# Patient Record
Sex: Female | Born: 1975 | Race: White
Health system: Northeastern US, Academic
[De-identification: ages and names within clinical notes are randomized; demographics above are authoritative.]

## PROBLEM LIST (undated history)

## (undated) DIAGNOSIS — I10 Essential (primary) hypertension: Secondary | ICD-10-CM

## (undated) HISTORY — PX: CARDIAC SURGERY: SHX584

## (undated) HISTORY — DX: Essential (primary) hypertension: I10

---

## 2005-05-17 ENCOUNTER — Inpatient Hospital Stay: Admit: 2005-05-17 | Discharge: 2005-05-17 | Disposition: A | Discharge status: Home / Self Care | Payer: Self-pay

## 2005-05-17 ENCOUNTER — Other Ambulatory Visit: Payer: Self-pay | Admitting: Gastroenterology

## 2005-10-19 ENCOUNTER — Inpatient Hospital Stay: Admit: 2005-10-19 | Discharge: 2005-10-19 | Disposition: A | Discharge status: Home / Self Care | Payer: Self-pay

## 2006-05-04 ENCOUNTER — Other Ambulatory Visit: Payer: Self-pay | Admitting: Gastroenterology

## 2006-05-04 ENCOUNTER — Inpatient Hospital Stay: Admit: 2006-05-04 | Discharge: 2006-05-04 | Disposition: A | Discharge status: Home / Self Care | Payer: Self-pay

## 2010-10-03 ENCOUNTER — Emergency Department: Payer: Self-pay | Admitting: *Deleted

## 2011-01-22 ENCOUNTER — Emergency Department: Payer: Self-pay | Admitting: Emergency Medicine

## 2011-01-22 LAB — TROPONIN I: Troponin-I: <0.02 ng/mL

## 2011-01-22 LAB — CBC
HCT: 37.6 % (ref 35.0–47.0)
HGB: 13.1 g/dL (ref 12.0–16.0)
MCH: 30.1 pg (ref 26.0–34.0)
MCV: 87 fL (ref 80–100)
RBC: 4.34 10*6/uL (ref 3.80–5.20)

## 2011-01-22 LAB — BASIC METABOLIC PANEL
Calcium, Total: 8.3 mg/dL — ABNORMAL LOW (ref 8.5–10.1)
Chloride: 103 mmol/L (ref 98–107)
Co2: 26 mmol/L (ref 21–32)
EGFR (African American): >60
Glucose: 160 mg/dL — ABNORMAL HIGH (ref 65–99)
Osmolality: 283 (ref 275–301)
Sodium: 140 mmol/L (ref 136–145)

## 2011-06-10 ENCOUNTER — Emergency Department: Payer: Self-pay | Admitting: Emergency Medicine

## 2011-06-11 LAB — URINALYSIS, COMPLETE
Bacteria: NONE SEEN
Bilirubin,UR: NEGATIVE
Glucose,UR: 50 mg/dL (ref 0–75)
Leukocyte Esterase: NEGATIVE
Nitrite: NEGATIVE
Ph: 6 (ref 4.5–8.0)
RBC,UR: 4 /HPF (ref 0–5)
Specific Gravity: 1.023 (ref 1.003–1.030)

## 2011-06-11 LAB — PREGNANCY, URINE: Pregnancy Test, Urine: NEGATIVE m[IU]/mL

## 2011-07-06 ENCOUNTER — Emergency Department: Payer: Self-pay | Admitting: Emergency Medicine

## 2011-07-08 LAB — BETA STREP CULTURE(ARMC)

## 2013-01-31 ENCOUNTER — Other Ambulatory Visit: Payer: Self-pay | Admitting: Gastroenterology

## 2013-01-31 ENCOUNTER — Inpatient Hospital Stay: Admit: 2013-01-31 | Discharge: 2013-01-31 | Disposition: A | Discharge status: Home / Self Care | Payer: Self-pay

## 2013-08-08 ENCOUNTER — Other Ambulatory Visit: Payer: Self-pay | Admitting: Gastroenterology

## 2013-08-08 ENCOUNTER — Inpatient Hospital Stay: Admit: 2013-08-08 | Discharge: 2013-08-08 | Disposition: A | Discharge status: Home / Self Care | Payer: Self-pay

## 2013-10-05 ENCOUNTER — Inpatient Hospital Stay: Admit: 2013-10-05 | Discharge: 2013-10-05 | Disposition: A | Discharge status: Home / Self Care | Payer: Self-pay

## 2013-10-05 ENCOUNTER — Other Ambulatory Visit: Payer: Self-pay | Admitting: Gastroenterology

## 2015-11-05 DIAGNOSIS — E1149 Type 2 diabetes mellitus with other diabetic neurological complication: Secondary | ICD-10-CM

## 2015-11-05 DIAGNOSIS — Z794 Long term (current) use of insulin: Secondary | ICD-10-CM

## 2015-11-05 HISTORY — DX: Type 2 diabetes mellitus with other diabetic neurological complication: E11.49

## 2015-11-05 HISTORY — DX: Long term (current) use of insulin: Z79.4

## 2015-11-11 DIAGNOSIS — Z86711 Personal history of pulmonary embolism: Secondary | ICD-10-CM | POA: Insufficient documentation

## 2015-11-11 DIAGNOSIS — F32A Depression, unspecified: Secondary | ICD-10-CM | POA: Insufficient documentation

## 2015-11-11 DIAGNOSIS — Z720 Tobacco use: Secondary | ICD-10-CM | POA: Insufficient documentation

## 2015-11-11 DIAGNOSIS — D72820 Lymphocytosis (symptomatic): Secondary | ICD-10-CM | POA: Insufficient documentation

## 2015-11-11 DIAGNOSIS — I252 Old myocardial infarction: Secondary | ICD-10-CM | POA: Insufficient documentation

## 2015-11-11 DIAGNOSIS — E781 Pure hyperglyceridemia: Secondary | ICD-10-CM | POA: Insufficient documentation

## 2015-11-11 DIAGNOSIS — Z955 Presence of coronary angioplasty implant and graft: Secondary | ICD-10-CM | POA: Insufficient documentation

## 2015-11-13 DIAGNOSIS — M542 Cervicalgia: Secondary | ICD-10-CM | POA: Insufficient documentation

## 2015-12-26 ENCOUNTER — Encounter: Payer: Self-pay | Admitting: Gastroenterology

## 2015-12-26 ENCOUNTER — Encounter: Payer: Self-pay | Admitting: Pain Medicine

## 2016-02-05 ENCOUNTER — Ambulatory Visit: Payer: Self-pay | Admitting: Pain Medicine

## 2016-02-05 ENCOUNTER — Encounter: Payer: Self-pay | Admitting: Pain Medicine

## 2016-02-05 VITALS — BP 108/68 | HR 65 | Temp 97.3°F | Resp 14 | Ht 61.0 in | Wt 232.0 lb

## 2016-02-05 DIAGNOSIS — M5416 Radiculopathy, lumbar region: Secondary | ICD-10-CM

## 2016-02-05 DIAGNOSIS — M7918 Myalgia, other site: Secondary | ICD-10-CM

## 2016-02-05 DIAGNOSIS — M533 Sacrococcygeal disorders, not elsewhere classified: Secondary | ICD-10-CM

## 2016-02-05 DIAGNOSIS — M5412 Radiculopathy, cervical region: Secondary | ICD-10-CM

## 2016-02-05 DIAGNOSIS — E1140 Type 2 diabetes mellitus with diabetic neuropathy, unspecified: Secondary | ICD-10-CM

## 2016-02-05 MED ORDER — LIDOCAINE 5 % EX PTCH *I*
1.0000 | MEDICATED_PATCH | CUTANEOUS | 3 refills | Status: AC
Start: 2016-02-05 — End: ?

## 2016-02-05 NOTE — H&P (Signed)
Pain Medicine Consult Note    This is a confidential report written only for the purpose of professional communication.  Clients who wish to receive these findings are requested to contact the Pain Treatment Center at [facility] [phone].       Wanda Steele is a 41 y.o. year old female.  DOB: [birth year]  Primary Care Physician: Melinda Crutch, MD    Chief Complaint:   Cervical and lumbar spine pain    History of Present Illness:    This is a pleasant 41 year old female who presents today for new patient consultation for both neck and low back pain.  She states she has had these pain problems for over a decade.  She was previously followed in [place] at [facility] where she was undergoing what sounds like cervical epidural steroid injections.  [place] is now relocated to [place] would like to reestablish pain management care.    At present she reports pain in her cervical spine which radiates into the shoulders and low back pain which radiates into the buttocks and bilateral lower extremities.  The pain is ranging anywhere from 5 out of 10-8 out of 10 in severity.  The pain is worse with ambulation worse with prolonged standing and movement alleviated with rest and medication albeit only modestly.  She has not had any recent imaging and presents with no imaging to review.  She denies bowel or bladder incontinence or any focal neurologic deficits.        Past Medical History  Her medical history is otherwise significant for coronary artery disease status post stent on Plavix, hypertension bronchitis GERD, diabetes type 2 with polyneuropathy, anxiety, history of MI, obesity, depression, bipolar.  Currently Active Problems  There is no problem list on file for this patient.      Past Surgical History  No past surgical history on file.    Family History  No family history on file.    Social History  Social History     Social History    Marital status: Single     Spouse name: N/A     Number of children: N/A    Years of education: N/A     Occupational History    Not on file.     Social History Main Topics    Smoking status: Current Every Day Smoker    Smokeless tobacco: Never Used    Alcohol use Not on file    Drug use: Not on file    Sexual activity: Not on file     Social History Narrative    No narrative on file           Allergies:   Allergies   Allergen Reactions    Latex Rash     & slimy    Aspirin Other (See Comments)     Pancreas flare up        Current Meds:    Current Outpatient Prescriptions   Medication    FREESTYLE LITE test strip    amitriptyline (ELAVIL) 50 MG tablet    atorvastatin (LIPITOR) 40 MG tablet    clopidogrel (PLAVIX) 75 MG tablet    FLUoxetine (PROZAC) 40 MG capsule    insulin glargine 100 unit/mL injection pen    lisinopril (PRINIVIL,ZESTRIL) 10 MG tablet    LORazepam (ATIVAN) 0.5 MG tablet    metFORMIN (GLUCOPHAGE) 1000 MG tablet    metoprolol (LOPRESSOR) 50 MG tablet    mirtazapine (REMERON) 30 MG  tablet    pantoprazole (PROTONIX) 40 MG EC tablet    ranitidine (ZANTAC) 150 MG tablet    varenicline (CHANTIX) 1 MG tablet    lidocaine (LIDODERM) 5 % patch     No current facility-administered medications for this visit.          Review Of Systems: A 14 item review of systems was performed per HPI otherwise negative.  She denies bowel or bladder incontinence focal neurologic deficits chest pain fevers or shortness of breath.      Physical Exam:  Pain    02/05/16 1357   PainSc:   8   PainLoc: Back      BP 108/68   Pulse 65   Temp 36.3 C (97.3 F)   Resp 14   Ht 1.549 m (5\' 1" )   Wt 105.2 kg (232 lb)   SpO2 96%   BMI 43.84 kg/m2      Gen: awake, alert, responsive, NAD  HEENT: NCAT  Skin: no rashes, lesions or alopecia  Abd: soft, ND  Resp: symmetric respiratory excursions  CV: 2+ distal pulses  Ext: no clubbing, cyanosis or edema  MSK:    Cervical:Decreased ROM in flexion and extension rotation and lateral bending. ; TTP, paraspinal  tenderness   Lumbar: Decreased ROM in flexion extension ; TTP, paraspinal tenderness; tenderness over bilateral sacroiliac joints and direct pressure.  Neuro: 5/5 BUE motor, 2/4 BUE reflexes, sensory intact BUE; 5/5 BLE motor, intact sensory and reflexes in BLE    Assessment:  Pleasant 41 year old woman with cervical lumbar back pain that appears to be a combination of radiculitis myofascial pain and osteoarthritis.  She does not have any recent imaging to review.  I requested that she obtain her prior imaging records from her previous pain clinic.  In addition it has been several years since she has had any cervical imaging therefore we will order a cervical MRI given the radiculopathy and weakness in her upper extremities.  She'll follow-up with me in clinic after she obtains this imaging.    Plan:   1.  Cervical MRI; consider cervical epidural steroid injection versus bilateral sacroiliac joint injection.  2.  Lidoderm patch for peripheral diabetic neuropathy.    3.  Follow-up 4 weeks.    Author: Lidia CollumAdam Zanden Colver, MD as of: 02/05/2016 at: 2:28 PM      Thank you for referring this patient to the [facility].    Sincerely,    Coralee NorthAdam J. Nandita Mathenia, M.D.  Chief, Division of Pain Medicine  Director, Pain Treatment Center  Department of Anesthesiology & Perioperative Medicine  [facility]    Associate Professor  [facility] [employer] [facility] [employer]  [address], [address]  [place], [place] [id number]  Phone: [phone]  Fax: [phone]

## 2016-03-05 ENCOUNTER — Ambulatory Visit: Admission: RE | Admit: 2016-03-05 | Payer: Self-pay | Source: Ambulatory Visit | Admitting: Pain Medicine

## 2016-03-05 ENCOUNTER — Ambulatory Visit: Payer: Self-pay | Admitting: Pain Medicine

## 2016-03-22 ENCOUNTER — Encounter: Payer: Self-pay | Admitting: Pain Medicine

## 2016-03-22 ENCOUNTER — Encounter: Payer: Self-pay | Admitting: Anesthesiology

## 2016-03-22 ENCOUNTER — Ambulatory Visit: Payer: MEDICAID | Attending: Pain Medicine | Admitting: Pain Medicine

## 2016-03-22 VITALS — BP 127/75 | HR 76 | Temp 96.6°F | Resp 14 | Ht 61.0 in | Wt 232.0 lb

## 2016-03-22 DIAGNOSIS — K219 Gastro-esophageal reflux disease without esophagitis: Secondary | ICD-10-CM | POA: Insufficient documentation

## 2016-03-22 DIAGNOSIS — F419 Anxiety disorder, unspecified: Secondary | ICD-10-CM | POA: Insufficient documentation

## 2016-03-22 DIAGNOSIS — G894 Chronic pain syndrome: Secondary | ICD-10-CM | POA: Insufficient documentation

## 2016-03-22 DIAGNOSIS — M542 Cervicalgia: Secondary | ICD-10-CM

## 2016-03-22 DIAGNOSIS — M4722 Other spondylosis with radiculopathy, cervical region: Secondary | ICD-10-CM

## 2016-03-22 DIAGNOSIS — M5412 Radiculopathy, cervical region: Secondary | ICD-10-CM | POA: Insufficient documentation

## 2016-03-22 DIAGNOSIS — K5900 Constipation, unspecified: Secondary | ICD-10-CM | POA: Insufficient documentation

## 2016-03-22 DIAGNOSIS — J449 Chronic obstructive pulmonary disease, unspecified: Secondary | ICD-10-CM | POA: Insufficient documentation

## 2016-03-22 DIAGNOSIS — M791 Myalgia: Secondary | ICD-10-CM | POA: Insufficient documentation

## 2016-03-22 DIAGNOSIS — M545 Low back pain: Secondary | ICD-10-CM

## 2016-03-22 DIAGNOSIS — M7918 Myalgia, other site: Secondary | ICD-10-CM

## 2016-03-22 DIAGNOSIS — I10 Essential (primary) hypertension: Secondary | ICD-10-CM | POA: Insufficient documentation

## 2016-03-22 DIAGNOSIS — M1288 Other specific arthropathies, not elsewhere classified, other specified site: Secondary | ICD-10-CM

## 2016-03-22 DIAGNOSIS — I2510 Atherosclerotic heart disease of native coronary artery without angina pectoris: Secondary | ICD-10-CM | POA: Insufficient documentation
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
## 2016-03-24 ENCOUNTER — Other Ambulatory Visit: Payer: Self-pay | Admitting: Pain Medicine

## 2016-03-30 ENCOUNTER — Telehealth: Payer: Self-pay

## 2016-03-30 NOTE — Telephone Encounter (Signed)
Patients sister is calling about an MRI that is scheduled for Wanda Steele on Friday, 3/30. She stated she brought old imaging to our office and the last note from Dr. Barnet Glasgowarinci stated no new imaging is necessary but there is an MRI ordered from Dr. Barnet Glasgowarinci. She would like to know if we still want the patient to get the MRI on Friday. Please advise. Thank you.

## 2016-03-31 NOTE — Telephone Encounter (Signed)
Thank you, I called and let her know.

## 2016-04-02 ENCOUNTER — Ambulatory Visit: Payer: MEDICAID | Admitting: Radiology

## 2016-07-09 ENCOUNTER — Ambulatory Visit: Payer: MEDICAID | Attending: Internal Medicine

## 2016-07-09 ENCOUNTER — Other Ambulatory Visit: Payer: Self-pay | Admitting: Emergency Medicine

## 2016-07-09 DIAGNOSIS — S6992XA Unspecified injury of left wrist, hand and finger(s), initial encounter: Secondary | ICD-10-CM

## 2016-07-09 DIAGNOSIS — S4992XA Unspecified injury of left shoulder and upper arm, initial encounter: Secondary | ICD-10-CM

## 2016-07-09 DIAGNOSIS — W19XXXA Unspecified fall, initial encounter: Secondary | ICD-10-CM

## 2017-07-04 ENCOUNTER — Emergency Department: Payer: Medicaid Other

## 2017-07-04 ENCOUNTER — Emergency Department
Admission: EM | Admit: 2017-07-04 | Discharge: 2017-07-04 | Payer: Medicaid Other | Attending: Emergency Medicine | Admitting: Emergency Medicine

## 2017-07-04 DIAGNOSIS — I63232 Cerebral infarction due to unspecified occlusion or stenosis of left carotid arteries: Secondary | ICD-10-CM | POA: Diagnosis not present

## 2017-07-04 DIAGNOSIS — Z794 Long term (current) use of insulin: Secondary | ICD-10-CM | POA: Insufficient documentation

## 2017-07-04 DIAGNOSIS — G8190 Hemiplegia, unspecified affecting unspecified side: Secondary | ICD-10-CM | POA: Insufficient documentation

## 2017-07-04 DIAGNOSIS — R4182 Altered mental status, unspecified: Secondary | ICD-10-CM | POA: Diagnosis present

## 2017-07-04 DIAGNOSIS — I6522 Occlusion and stenosis of left carotid artery: Secondary | ICD-10-CM

## 2017-07-04 DIAGNOSIS — R402431 Glasgow coma scale score 3-8, in the field [EMT or ambulance]: Secondary | ICD-10-CM | POA: Diagnosis not present

## 2017-07-04 DIAGNOSIS — R739 Hyperglycemia, unspecified: Secondary | ICD-10-CM | POA: Diagnosis not present

## 2017-07-04 DIAGNOSIS — Z7902 Long term (current) use of antithrombotics/antiplatelets: Secondary | ICD-10-CM | POA: Insufficient documentation

## 2017-07-04 LAB — URINALYSIS, ROUTINE W REFLEX MICROSCOPIC
BILIRUBIN URINE: NEGATIVE
Bacteria, UA: NONE SEEN
Glucose, UA: >=500 mg/dL — AB
Hgb urine dipstick: NEGATIVE
Ketones, ur: 20 mg/dL — AB
Leukocytes, UA: NEGATIVE
Nitrite: NEGATIVE
PH: 6 (ref 5.0–8.0)
Protein, ur: NEGATIVE mg/dL

## 2017-07-04 LAB — URINE DRUG SCREEN, QUALITATIVE (ARMC ONLY)
AMPHETAMINES, UR SCREEN: NOT DETECTED
BENZODIAZEPINE, UR SCRN: NOT DETECTED
Cannabinoid 50 Ng, Ur ~~LOC~~: NOT DETECTED
Cocaine Metabolite,Ur ~~LOC~~: NOT DETECTED
MDMA (Ecstasy)Ur Screen: NOT DETECTED
Methadone Scn, Ur: NOT DETECTED
Opiate, Ur Screen: NOT DETECTED
PHENCYCLIDINE (PCP) UR S: NOT DETECTED
Tricyclic, Ur Screen: POSITIVE — AB

## 2017-07-04 LAB — DIFFERENTIAL
Basophils Absolute: 0 10*3/uL (ref 0–0.1)
Basophils Relative: 0 %
EOS ABS: 0 10*3/uL (ref 0–0.7)
Eosinophils Relative: 0 %
LYMPHS ABS: 1.8 10*3/uL (ref 1.0–3.6)
Lymphocytes Relative: 17 %
MONOS PCT: 5 %
Monocytes Absolute: 0.5 10*3/uL (ref 0.2–0.9)
Neutro Abs: 8.1 10*3/uL — ABNORMAL HIGH (ref 1.4–6.5)
Neutrophils Relative %: 78 %

## 2017-07-04 LAB — COMPREHENSIVE METABOLIC PANEL
ALT: 24 U/L (ref 0–44)
ANION GAP: 8 (ref 5–15)
AST: 28 U/L (ref 15–41)
Albumin: 3.7 g/dL (ref 3.5–5.0)
Alkaline Phosphatase: 100 U/L (ref 38–126)
BILIRUBIN TOTAL: 0.7 mg/dL (ref 0.3–1.2)
BUN: 16 mg/dL (ref 6–20)
CO2: 26 mmol/L (ref 22–32)
Calcium: 9 mg/dL (ref 8.9–10.3)
Chloride: 104 mmol/L (ref 98–111)
Creatinine, Ser: 0.68 mg/dL (ref 0.44–1.00)
GFR calc Af Amer: >60 mL/min (ref >60)
GFR calc non Af Amer: >60 mL/min (ref >60)
Glucose, Bld: 319 mg/dL — ABNORMAL HIGH (ref 70–99)
POTASSIUM: 4.2 mmol/L (ref 3.5–5.1)
Sodium: 138 mmol/L (ref 135–145)
TOTAL PROTEIN: 6.9 g/dL (ref 6.5–8.1)

## 2017-07-04 LAB — CBC
HCT: 35.5 % (ref 35.0–47.0)
HEMOGLOBIN: 11.9 g/dL — AB (ref 12.0–16.0)
MCH: 26 pg (ref 26.0–34.0)
MCHC: 33.5 g/dL (ref 32.0–36.0)
MCV: 77.6 fL — ABNORMAL LOW (ref 80.0–100.0)
Platelets: 289 10*3/uL (ref 150–440)
RBC: 4.58 MIL/uL (ref 3.80–5.20)
RDW: 18.3 % — ABNORMAL HIGH (ref 11.5–14.5)
WBC: 10.6 10*3/uL (ref 3.6–11.0)

## 2017-07-04 LAB — APTT: APTT: 24 s (ref 24–36)

## 2017-07-04 LAB — ETHANOL: Alcohol, Ethyl (B): <10 mg/dL (ref <10)

## 2017-07-04 LAB — PROTIME-INR
INR: 1.05
Prothrombin Time: 13.6 seconds (ref 11.4–15.2)

## 2017-07-04 LAB — TROPONIN I

## 2017-07-04 MED ORDER — SODIUM CHLORIDE 0.9 % IV BOLUS
1000.0000 mL | Freq: Once | INTRAVENOUS | Status: AC
  Administered 2017-07-04: 1000 mL via INTRAVENOUS

## 2017-07-04 MED ORDER — PROPOFOL 1000 MG/100ML IV EMUL
INTRAVENOUS | Status: AC
  Filled 2017-07-04: qty 100

## 2017-07-04 MED ORDER — FENTANYL 2500MCG IN NS 250ML (10MCG/ML) PREMIX INFUSION
100.0000 ug/h | INTRAVENOUS | Status: DC
Start: 2017-07-04 — End: 2017-07-04
  Administered 2017-07-04: 100 ug/h via INTRAVENOUS
  Filled 2017-07-04 (×2): qty 250

## 2017-07-04 MED ORDER — PROPOFOL 1000 MG/100ML IV EMUL
5.0000 ug/kg/min | Freq: Once | INTRAVENOUS | Status: AC
  Administered 2017-07-04: 5 ug/kg/min via INTRAVENOUS

## 2017-07-04 MED ORDER — IOHEXOL 350 MG/ML SOLN
100.0000 mL | Freq: Once | INTRAVENOUS | Status: AC | PRN
  Administered 2017-07-04: 100 mL via INTRAVENOUS

## 2017-07-04 MED ORDER — LORAZEPAM 2 MG/ML IJ SOLN
2.0000 mg | Freq: Once | INTRAMUSCULAR | Status: AC
  Administered 2017-07-04: 2 mg via INTRAVENOUS

## 2017-07-04 MED ORDER — ROCURONIUM BROMIDE 50 MG/5ML IV SOLN
100.0000 mg | Freq: Once | INTRAVENOUS | Status: DC
  Filled 2017-07-04: qty 10

## 2017-07-04 MED ORDER — IOHEXOL 350 MG/ML SOLN
75.0000 mL | Freq: Once | INTRAVENOUS | Status: AC | PRN
  Administered 2017-07-04: 75 mL via INTRAVENOUS

## 2017-07-04 MED ORDER — KETAMINE HCL 10 MG/ML IJ SOLN: 100.0000 mg | Freq: Once | INTRAMUSCULAR | Status: DC

## 2017-07-04 MED ORDER — KETAMINE HCL 10 MG/ML IJ SOLN
100.0000 mg | Freq: Once | INTRAMUSCULAR | Status: AC
  Administered 2017-07-04: 100 mg via INTRAVENOUS

## 2017-07-04 MED ORDER — ROCURONIUM BROMIDE 50 MG/5ML IV SOLN
100.0000 mg | Freq: Once | INTRAVENOUS | Status: AC
  Administered 2017-07-04: 100 mg via INTRAVENOUS
  Filled 2017-07-04: qty 10

## 2017-07-04 NOTE — ED Notes (Signed)
Patient changed by [facility] to from this ED equipment, to their equipment
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
## 2017-07-04 NOTE — ED Notes (Signed)
MD Rifenbark at bedside. Patient in active seizure. MD ordered 2 mg ativan. Ativan administered.
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
## 2017-07-04 NOTE — ED Notes (Signed)
Upon arrival patient moving all extremities, weakness noted on right side. Patient's eyes deviated left, and approx 5mm in size. Patient unable to follow commands. Patient has right-sided neglect.
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
## 2017-07-04 NOTE — ED Notes (Signed)
Eileen StanfordJenna RN, Lea RN, Onalee Huaavid RN, Jacki ConesLaurie RN, Nance PewGraham Medic, Dee RT, MD [facility] at bedside for RSI.

## 2017-07-04 NOTE — ED Notes (Signed)
Patient transported to CT 

## 2017-07-04 NOTE — ED Notes (Signed)
[facility] arrived to patient room

## 2017-07-04 NOTE — ED Triage Notes (Addendum)
Patient coming [facility] for unresponsiveness. Bruising noted to patient's throat and chest. Patient has right-sided neglect. Patient has emesis present of face/clothes. Patient making non-purposeful movements. Patient's EMS [id number].

## 2017-07-04 NOTE — ED Notes (Signed)
CT informed of code stroke

## 2017-07-04 NOTE — ED Notes (Signed)
RT paged for RSI

## 2017-07-14 LAB — BLOOD GAS, ARTERIAL
Acid-base deficit: 2.9 mmol/L — ABNORMAL HIGH (ref 0.0–2.0)
Bicarbonate: 22.6 mmol/L (ref 20.0–28.0)
FIO2: 0.5
MECHANICAL RATE: 16
MECHVT: 500 mL
O2 Saturation: 98.4 %
PCO2 ART: 41 mmHg (ref 32.0–48.0)
PEEP: 5 cmH2O
Patient temperature: 37
pH, Arterial: 7.35 (ref 7.350–7.450)
pO2, Arterial: 117 mmHg — ABNORMAL HIGH (ref 83.0–108.0)

## 2017-08-04 DEATH — deceased

## 2019-10-11 IMAGING — DX DG ABDOMEN 1V
1 series · 1 of 1 positions shown · non-contrast
Comparison: None.

CLINICAL DATA: Orogastric tube placement

EXAM:
ABDOMEN - 1 VIEW

[abdomen kub]
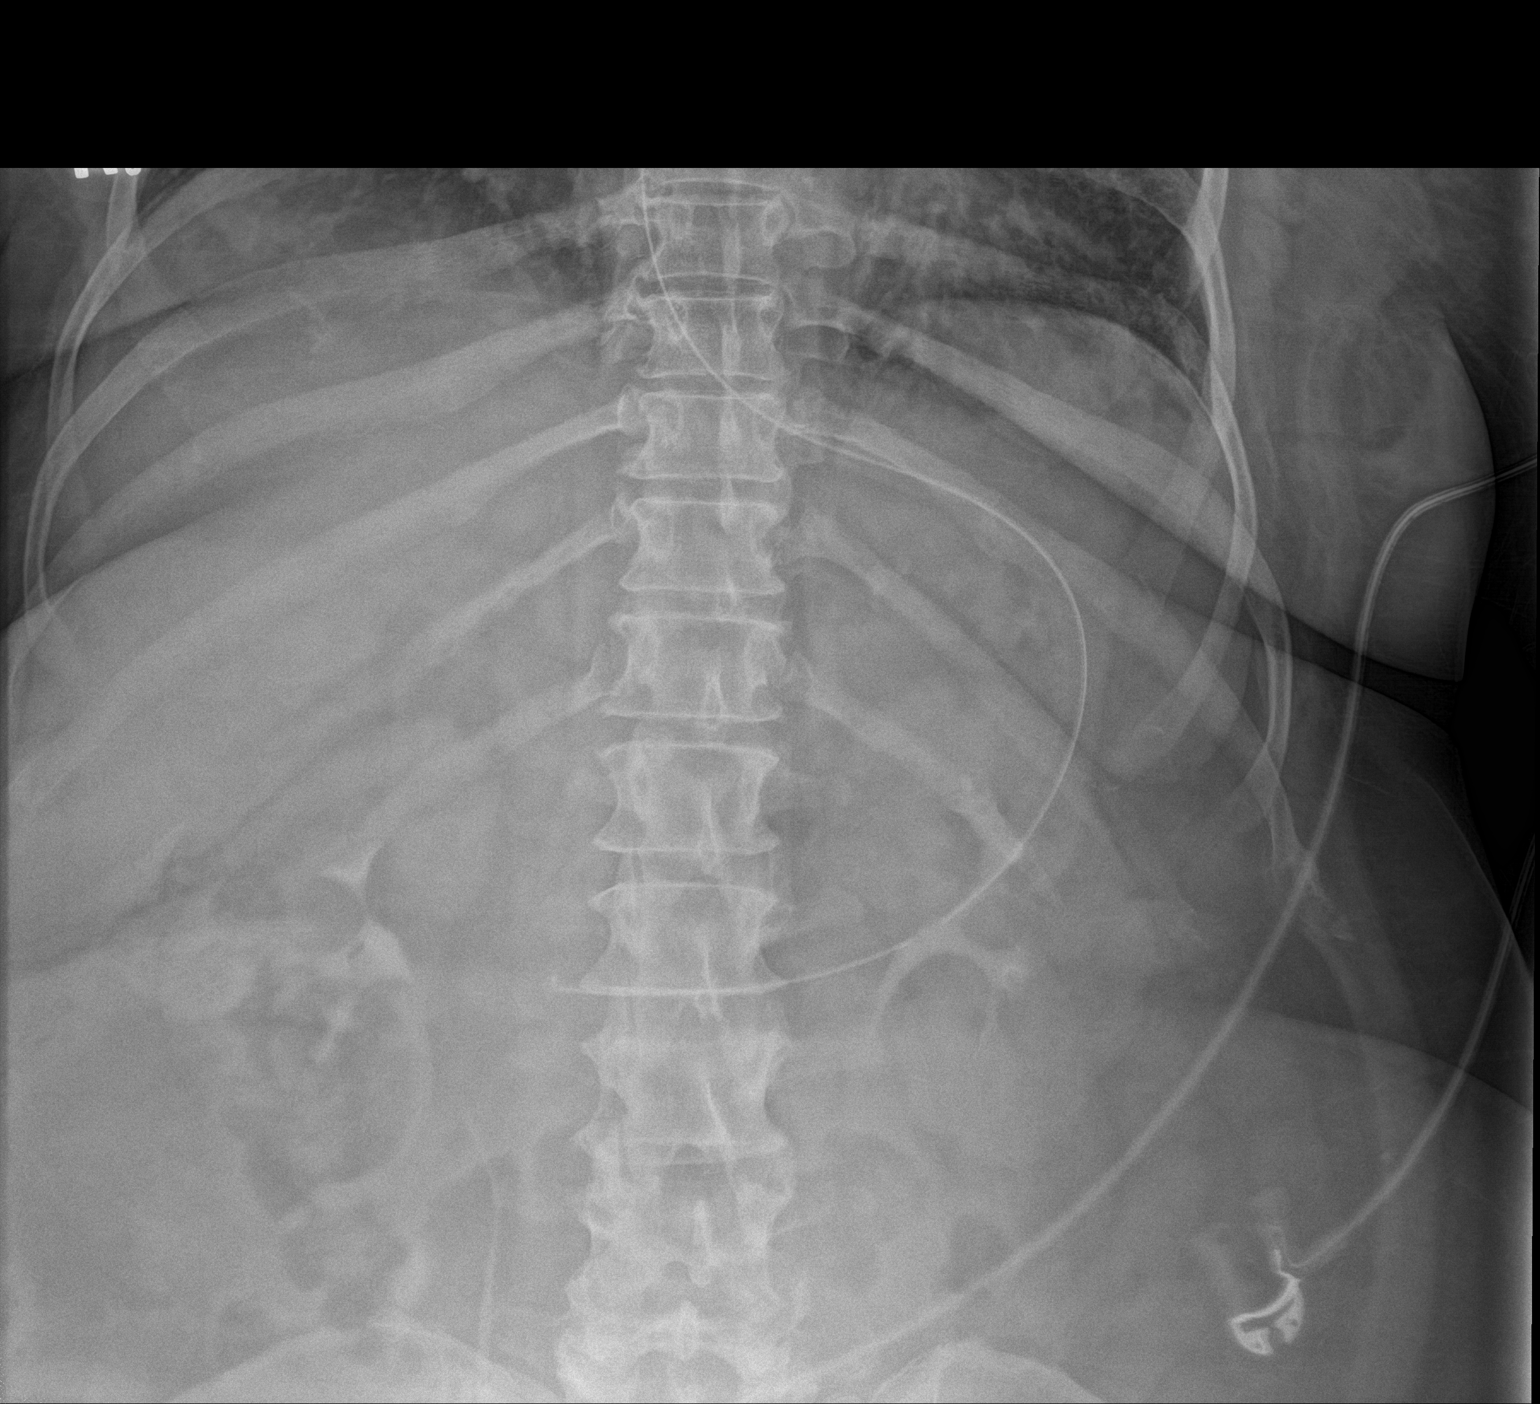

[1 of 1 positions shown; findings below may reference images not displayed]

FINDINGS: Tip and side port of the orogastric tube project over the stomach.
Abdomen is otherwise unremarkable.
IMPRESSION: OG tube tip and side port projecting over the stomach.

## 2019-10-11 IMAGING — CT CT ANGIO HEAD
3 of 7 series · 10 of 36 positions shown · IV contrast (APPLIED)
Comparison: None.

CLINICAL DATA: Right-sided neglect.  Emesis.  Unresponsive .

EXAM:
CT ANGIOGRAPHY HEAD AND NECK
TECHNIQUE: Multidetector CT imaging of the head and neck was performed using
the standard protocol during bolus administration of intravenous
contrast. Multiplanar CT image reconstructions and MIPs were
obtained to evaluate the vascular anatomy. Carotid stenosis
measurements (when applicable) are obtained utilizing NASCET
criteria, using the distal internal carotid diameter as the
denominator.
CONTRAST:  75mL OMNIPAQUE IOHEXOL 350 MG/ML SOLN

[Series 4: cta head neck · axial · 0.52mm/px · z∈[-229,-133]mm · 2 of 144 slices shown]
[im 48/144  soft-tissue]
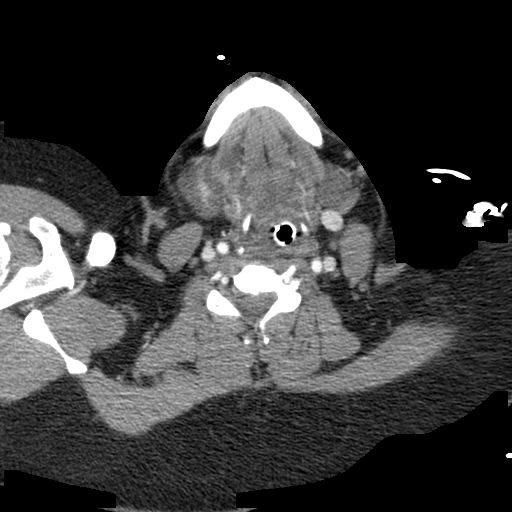
[im 96/144  soft-tissue]
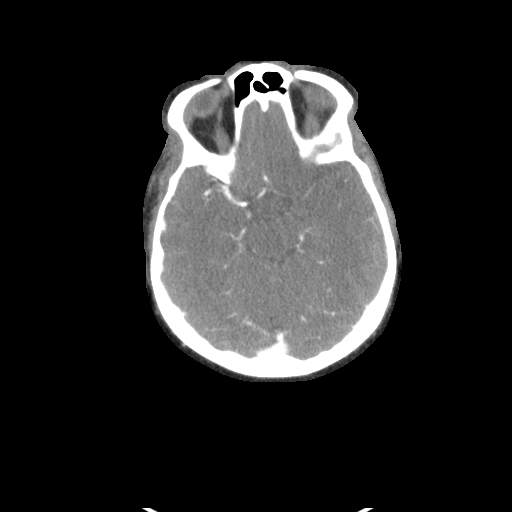

[Series 6: ax thin · axial · 0.32mm/px · z∈[-316,-104]mm · 6 of 311 slices shown]
[im 45/311  soft-tissue]
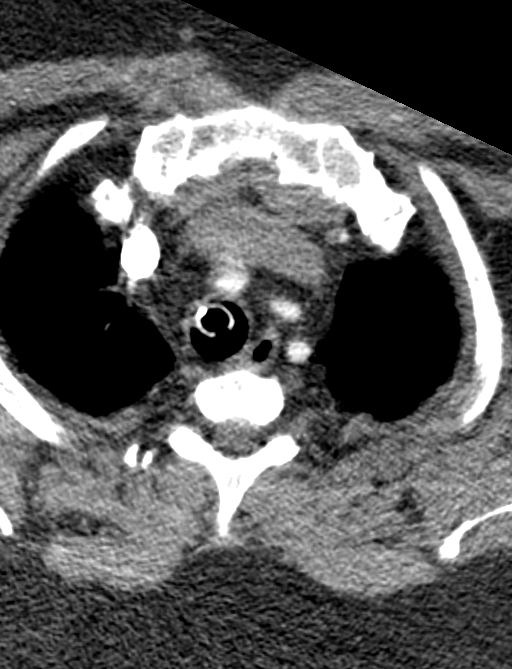
[im 89/311  bone]
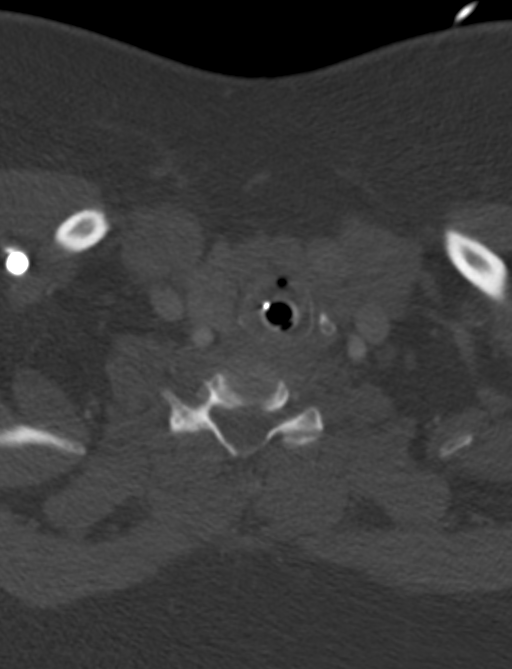
[im 133/311  soft-tissue]
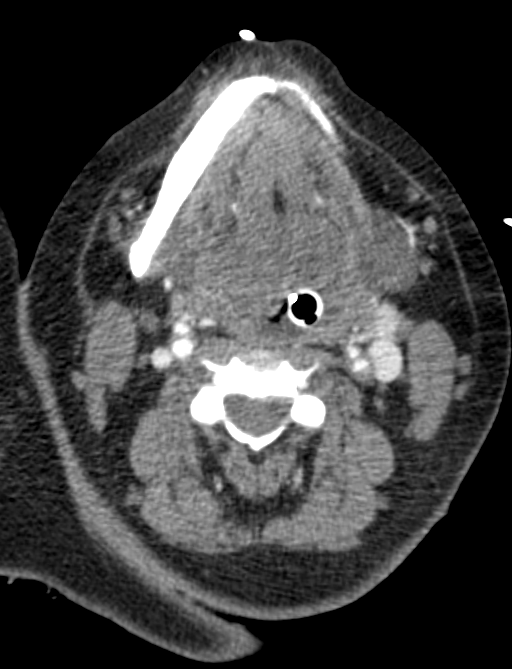
[im 178/311  bone]
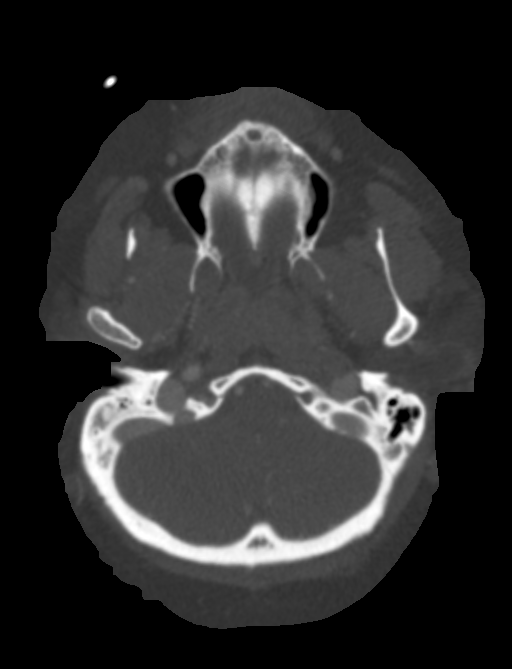
[im 222/311  soft-tissue]
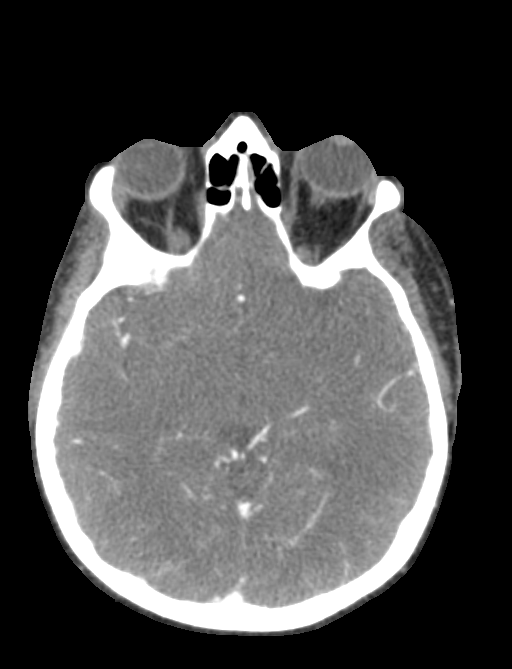
[im 266/311  bone]
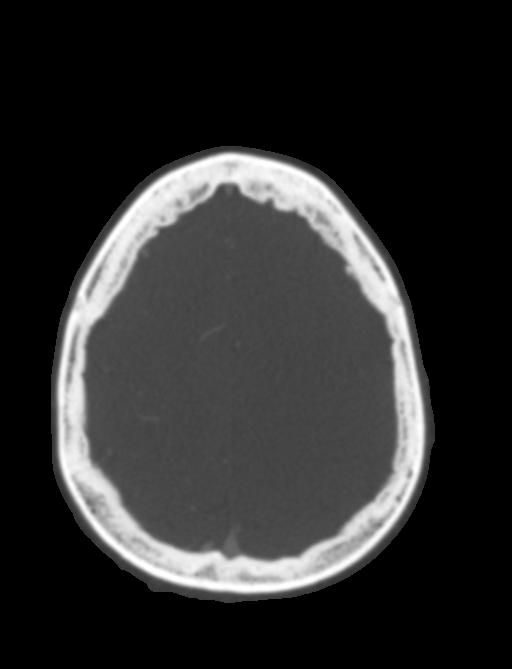

[Series 8: sagittal thin · sagittal · 0.42mm/px · 2 of 167 slices shown]
[im 41/167  soft-tissue]
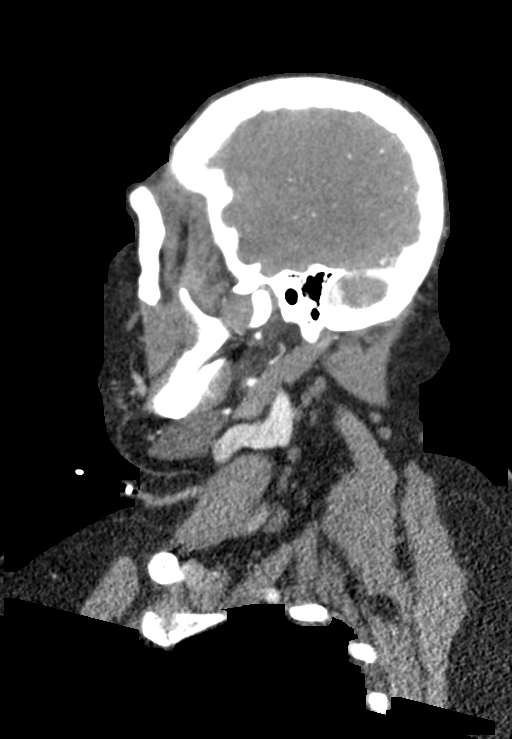
[im 127/167  soft-tissue]
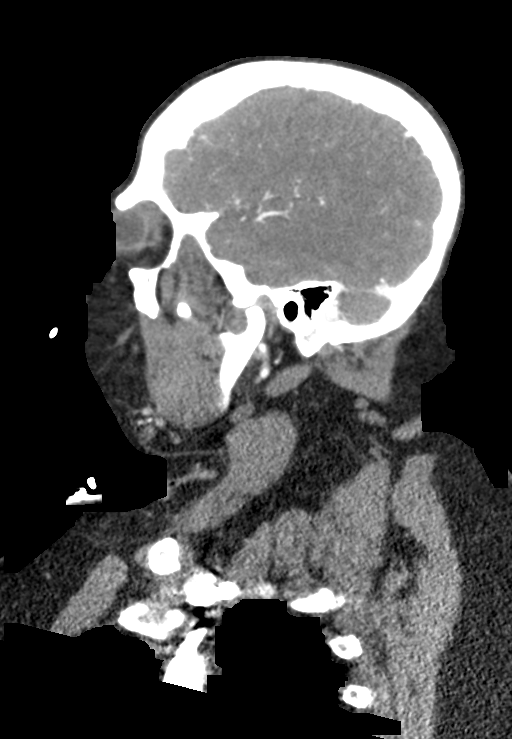

[10 of 36 positions shown; findings below may reference images not displayed]

FINDINGS: CT HEAD FINDINGS

Brain: There is no mass, hemorrhage or extra-axial collection. The
size and configuration of the ventricles and extra-axial CSF spaces
are normal. There is hypoattenuation of the left caudate and
lentiform nucleus, as well as the left insula.

Vascular: No abnormal hyperdensity of the major intracranial
arteries or dural venous sinuses. No intracranial atherosclerosis.

Skull: The visualized skull base, calvarium and extracranial soft
tissues are normal.

Sinuses/Orbits: No fluid levels or advanced mucosal thickening of
the visualized paranasal sinuses. No mastoid or middle ear effusion.
The orbits are normal.

ASPECTS (Alberta Stroke Program Early CT Score)

- Ganglionic level infarction (caudate, lentiform nuclei, internal
capsule, insula, M1-M3 cortex): 4

- Supraganglionic infarction (M4-M6 cortex): 3

Total score (0-10 with 10 being normal): 7

CTA NECK FINDINGS

AORTIC ARCH: There is no calcific atherosclerosis of the aortic
arch. There is no aneurysm, dissection or hemodynamically
significant stenosis of the visualized ascending aorta and aortic
arch. Conventional 3 vessel aortic branching pattern. The visualized
proximal subclavian arteries are widely patent.

RIGHT CAROTID SYSTEM:

--Common carotid artery: Widely patent origin without common carotid
artery dissection or aneurysm.

--Internal carotid artery: No dissection, occlusion or aneurysm. No
hemodynamically significant stenosis.

--External carotid artery: No acute abnormality.

LEFT CAROTID SYSTEM:

--Common carotid artery: Widely patent origin without common carotid
artery dissection or aneurysm.

--Internal carotid artery:Occluded just beyond the bifurcation.
There is short segment luminal irregularity proximal to complete
loss of contrast filling. There is no enhancement of the more distal
ICA.

--External carotid artery: No acute abnormality.

VERTEBRAL ARTERIES: Right dominant configuration. Both origins are
normal. No dissection, occlusion or flow-limiting stenosis to the
vertebrobasilar confluence.

SKELETON: There is no bony spinal canal stenosis. No lytic or
blastic lesion.

OTHER NECK: The patient is intubated. No focal neck soft tissue
abnormality.

UPPER CHEST: No pneumothorax or pleural effusion. No nodules or
masses.

CTA HEAD FINDINGS

ANTERIOR CIRCULATION:

--Intracranial internal carotid arteries: The left ICA is completely
occluded. The right ICA is normal.

--Anterior cerebral arteries: There is no opacification of the left
anterior cerebral artery A1 segment. The left A2 segment branches
are also non-opacified.

--Middle cerebral arteries: No opacification of the left middle
cerebral artery. Limited collateral flow in the left MCA territory.

--Posterior communicating arteries: Absent bilaterally.

POSTERIOR CIRCULATION:

--Basilar artery: Normal.

--Posterior cerebral arteries: Normal.

--Superior cerebellar arteries: Normal.

--Inferior cerebellar arteries: Normal anterior and posterior
inferior cerebellar arteries.

VENOUS SINUSES: As permitted by contrast timing, patent.

ANATOMIC VARIANTS: None

DELAYED PHASE: No parenchymal contrast enhancement.

Review of the MIP images confirms the above findings.
IMPRESSION: 1. Occlusion of the left internal carotid artery just beyond the
carotid bifurcation, with complete loss of enhancement along the
remainder of its course. No opacification within the left anterior
and middle cerebral arteries. Poor collateralization.
2. The etiology of the internal carotid artery occlusion is not
certain, but in the provided clinical context, this may be a
dissection with resultant thrombosis.
3. Areas of ischemic infarction within the left basal ganglia and
insula. ASPECTS is 7.
4. No intracranial hemorrhage.

Critical Value/emergent results were called by telephone at the time
of interpretation on 07/04/2017 at [DATE] to Dr. ILABEN BLAIN , who
verbally acknowledged these results.
